# Patient Record
Sex: Female | Born: 1965 | Race: Asian | Hispanic: No | Marital: Married
Health system: Southern US, Community
[De-identification: ages and names within clinical notes are randomized; demographics above are authoritative.]

---

## 2000-10-28 ENCOUNTER — Other Ambulatory Visit: Admission: RE | Admit: 2000-10-28 | Discharge: 2000-10-28 | Payer: Self-pay | Admitting: *Deleted

## 2000-10-28 ENCOUNTER — Other Ambulatory Visit: Admission: RE | Admit: 2000-10-28 | Discharge: 2000-10-28 | Payer: Self-pay | Admitting: Unknown Physician Specialty

## 2001-03-26 ENCOUNTER — Inpatient Hospital Stay (HOSPITAL_COMMUNITY): Admission: AD | Admit: 2001-03-26 | Discharge: 2001-04-02 | Payer: Self-pay | Admitting: Obstetrics and Gynecology

## 2001-03-26 ENCOUNTER — Encounter (INDEPENDENT_AMBULATORY_CARE_PROVIDER_SITE_OTHER): Payer: Self-pay

## 2001-03-26 ENCOUNTER — Encounter: Payer: Self-pay | Admitting: Obstetrics and Gynecology

## 2001-03-27 ENCOUNTER — Encounter: Payer: Self-pay | Admitting: *Deleted

## 2001-04-03 ENCOUNTER — Encounter: Admission: RE | Admit: 2001-04-03 | Discharge: 2001-05-03 | Payer: Self-pay | Admitting: *Deleted

## 2001-05-03 ENCOUNTER — Other Ambulatory Visit: Admission: RE | Admit: 2001-05-03 | Discharge: 2001-05-03 | Payer: Self-pay | Admitting: *Deleted

## 2002-01-13 ENCOUNTER — Other Ambulatory Visit: Admission: RE | Admit: 2002-01-13 | Discharge: 2002-01-13 | Payer: Self-pay | Admitting: *Deleted

## 2002-05-31 ENCOUNTER — Observation Stay (HOSPITAL_COMMUNITY): Admission: AD | Admit: 2002-05-31 | Discharge: 2002-06-01 | Payer: Self-pay | Admitting: Obstetrics and Gynecology

## 2002-05-31 ENCOUNTER — Encounter: Payer: Self-pay | Admitting: Obstetrics and Gynecology

## 2002-06-01 ENCOUNTER — Encounter: Payer: Self-pay | Admitting: Cardiovascular Disease

## 2002-06-01 ENCOUNTER — Encounter: Payer: Self-pay | Admitting: Obstetrics and Gynecology

## 2002-07-01 ENCOUNTER — Inpatient Hospital Stay (HOSPITAL_COMMUNITY): Admission: AD | Admit: 2002-07-01 | Discharge: 2002-07-01 | Payer: Self-pay | Admitting: Obstetrics & Gynecology

## 2002-07-14 ENCOUNTER — Inpatient Hospital Stay (HOSPITAL_COMMUNITY): Admission: AD | Admit: 2002-07-14 | Discharge: 2002-07-14 | Payer: Self-pay | Admitting: Obstetrics & Gynecology

## 2002-08-22 ENCOUNTER — Inpatient Hospital Stay (HOSPITAL_COMMUNITY): Admission: AD | Admit: 2002-08-22 | Discharge: 2002-08-26 | Payer: Self-pay | Admitting: *Deleted

## 2002-08-23 ENCOUNTER — Encounter (INDEPENDENT_AMBULATORY_CARE_PROVIDER_SITE_OTHER): Payer: Self-pay

## 2002-09-16 ENCOUNTER — Other Ambulatory Visit: Admission: RE | Admit: 2002-09-16 | Discharge: 2002-09-16 | Payer: Self-pay | Admitting: *Deleted

## 2002-11-01 ENCOUNTER — Inpatient Hospital Stay (HOSPITAL_COMMUNITY): Admission: AD | Admit: 2002-11-01 | Discharge: 2002-11-01 | Payer: Self-pay | Admitting: *Deleted

## 2003-10-09 ENCOUNTER — Other Ambulatory Visit: Admission: RE | Admit: 2003-10-09 | Discharge: 2003-10-09 | Payer: Self-pay | Admitting: *Deleted

## 2006-11-25 ENCOUNTER — Ambulatory Visit (HOSPITAL_COMMUNITY): Admission: RE | Admit: 2006-11-25 | Discharge: 2006-11-25 | Payer: Self-pay | Admitting: *Deleted

## 2006-12-09 ENCOUNTER — Encounter: Admission: RE | Admit: 2006-12-09 | Discharge: 2006-12-09 | Payer: Self-pay | Admitting: *Deleted

## 2007-11-29 ENCOUNTER — Encounter: Admission: RE | Admit: 2007-11-29 | Discharge: 2007-11-29 | Payer: Self-pay | Admitting: *Deleted

## 2008-11-07 ENCOUNTER — Encounter: Admission: RE | Admit: 2008-11-07 | Discharge: 2008-11-07 | Payer: Self-pay | Admitting: Psychiatry

## 2010-08-21 IMAGING — CT CT HEAD W/O CM
1 series · 16 of 28 positions shown, 20 images · non-contrast
Comparison: None

CLINICAL DATA: Headaches.

CT HEAD WITHOUT CONTRAST
TECHNIQUE: Contiguous axial images were obtained from the base of
the skull through the vertex without contrast.

[Series 32: 3d filtered head · axial · 0.49mm/px · z∈[+28,+159]mm · 16 of 28 slices shown, 20 images]
[im 2/28  brain]
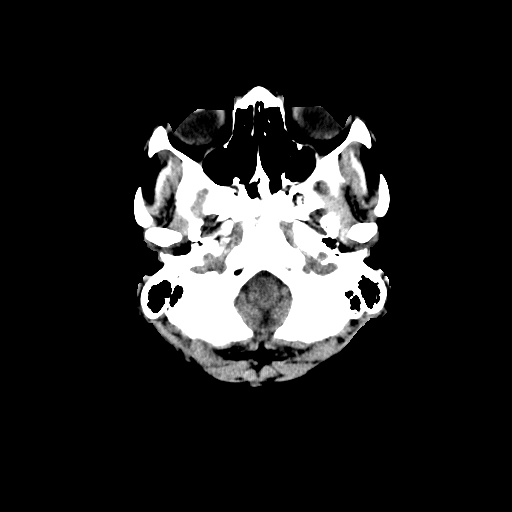
[im 2/28  bone]
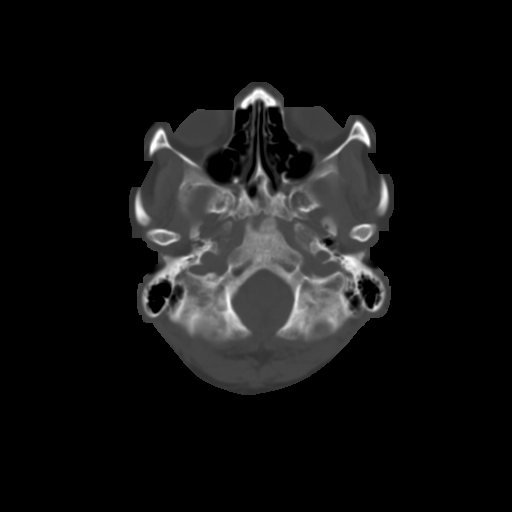
[im 4/28  brain]
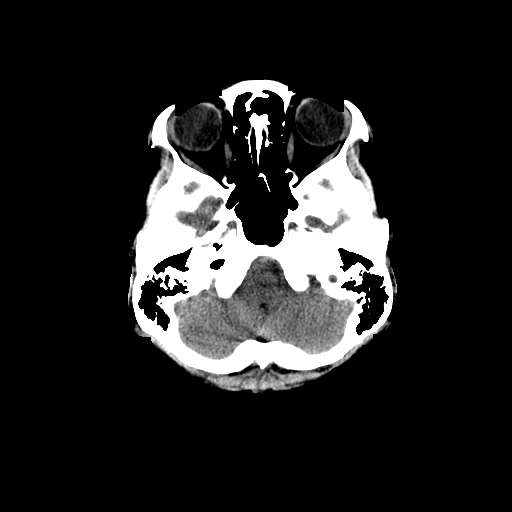
[im 6/28  brain]
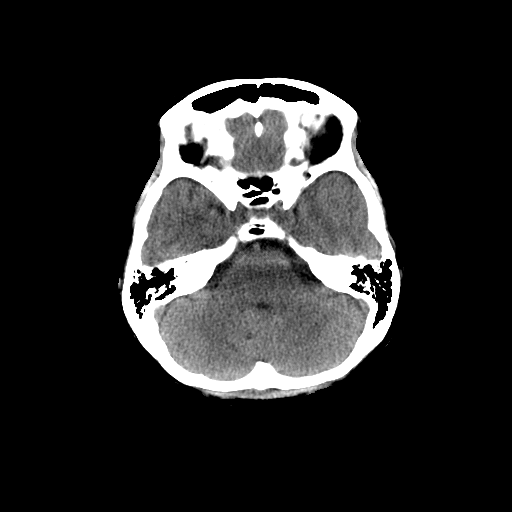
[im 7/28  brain]
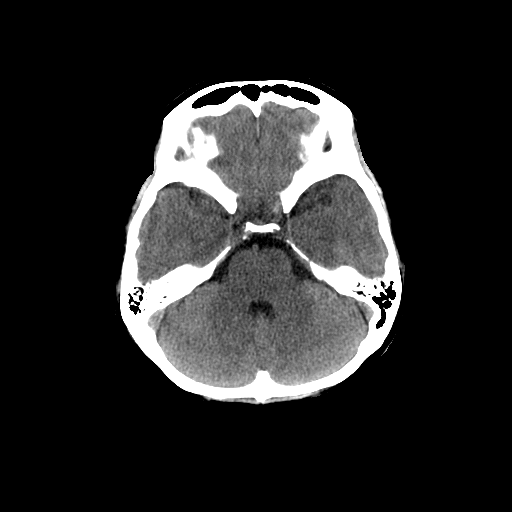
[im 9/28  brain]
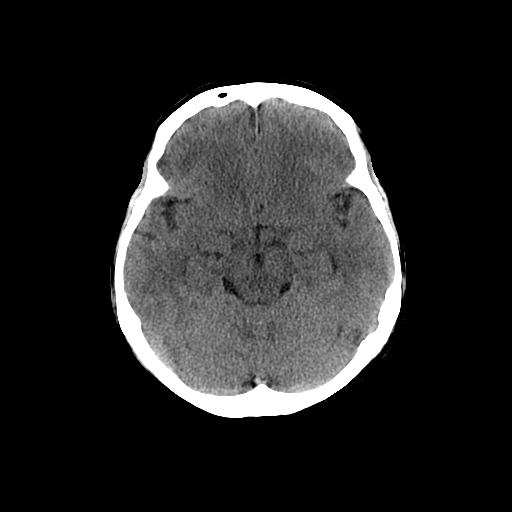
[im 9/28  bone]
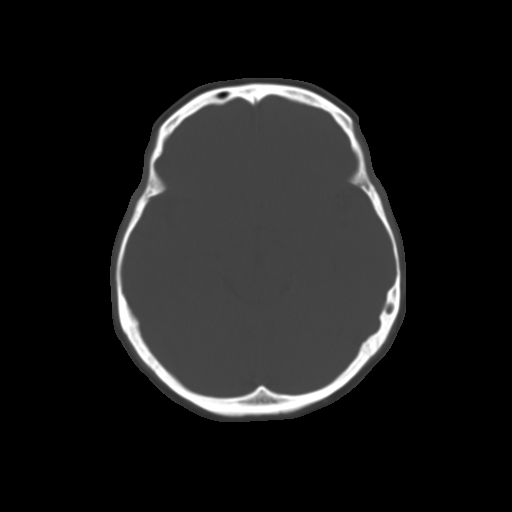
[im 10/28  brain]
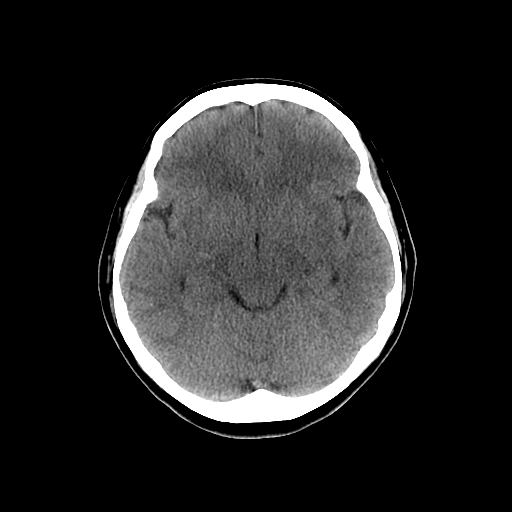
[im 12/28  brain]
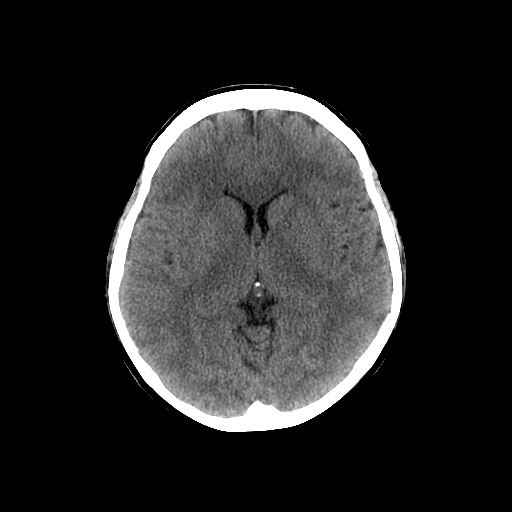
[im 14/28  brain]
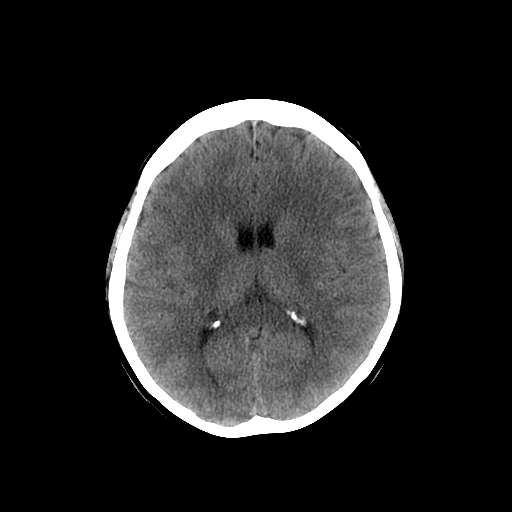
[im 15/28  brain]
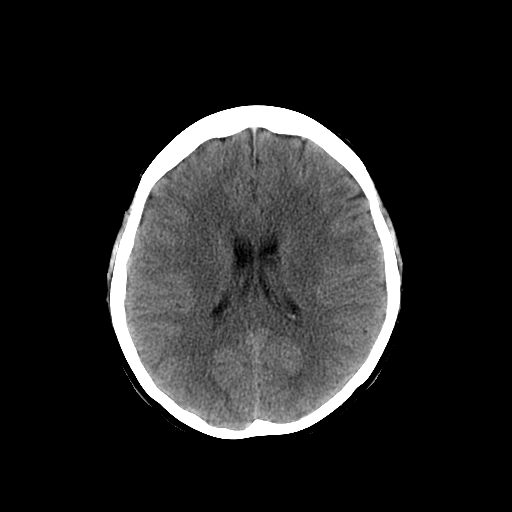
[im 15/28  bone]
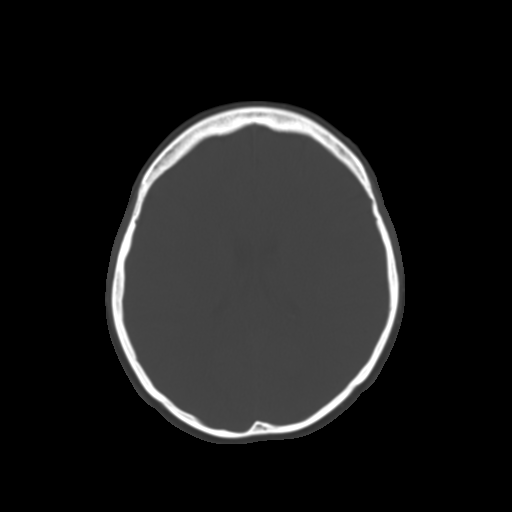
[im 17/28  brain]
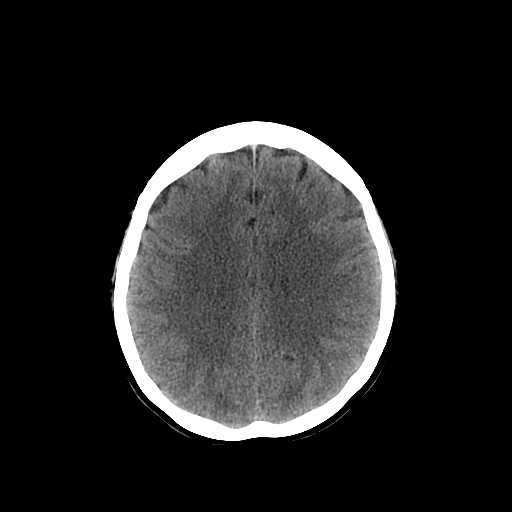
[im 19/28  brain]
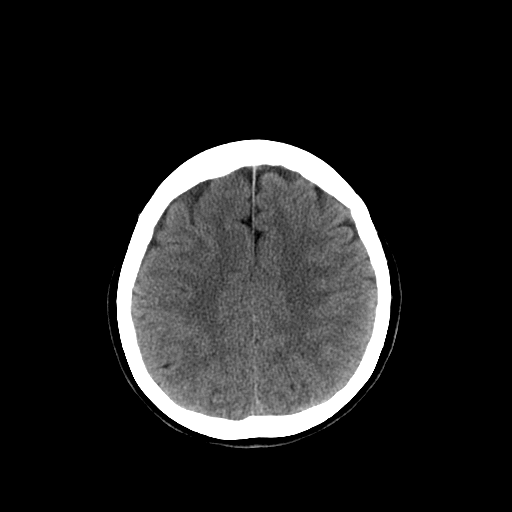
[im 20/28  brain]
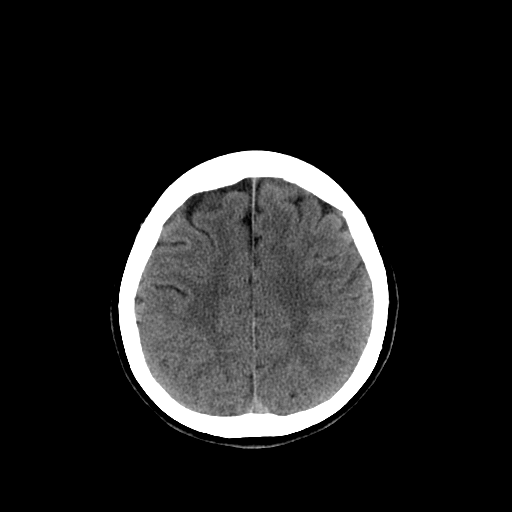
[im 22/28  brain]
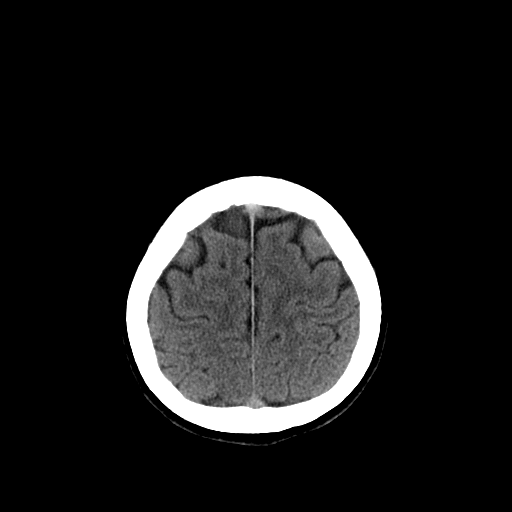
[im 22/28  bone]
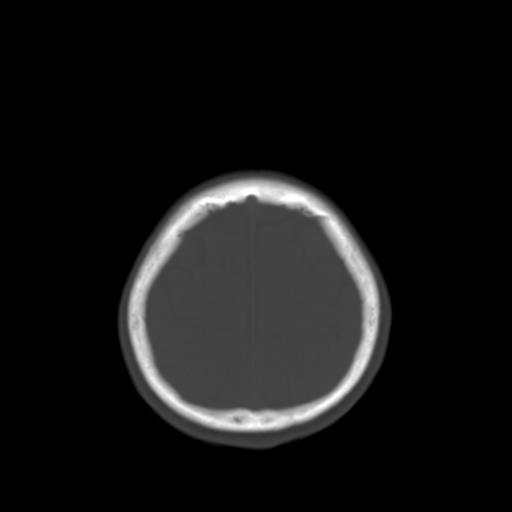
[im 23/28  brain]
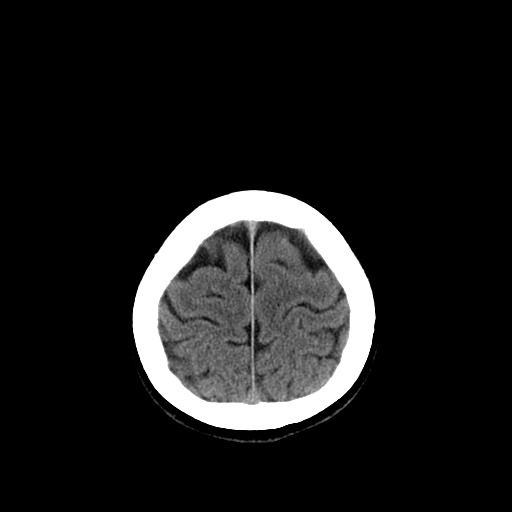
[im 25/28  brain]
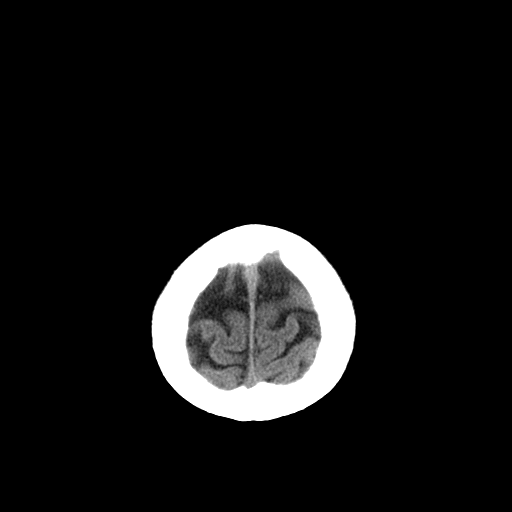
[im 27/28  brain]
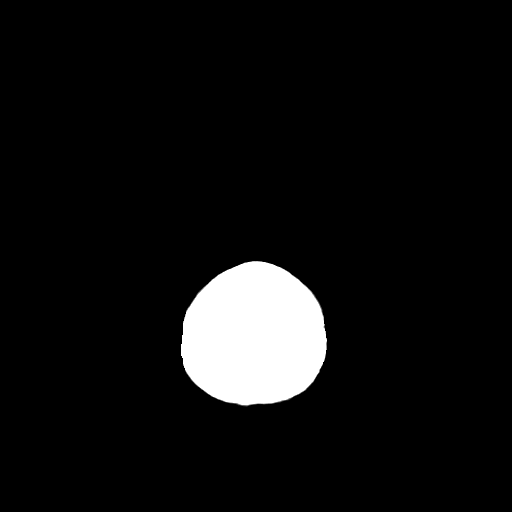

[16 of 28 positions shown; findings below may reference images not displayed]

FINDINGS: Bone windows demonstrate clear paranasal sinuses and
mastoid air cells.

Soft tissue windows demonstrate no  mass lesion, hemorrhage,
hydrocephalus, acute infarct, intra-axial, or extra-axial fluid
collection.
IMPRESSION: No acute intracranial abnormality.

## 2010-11-17 ENCOUNTER — Encounter: Payer: Self-pay | Admitting: *Deleted

## 2011-03-14 NOTE — Op Note (Signed)
NAME:  Cindy Sherman, Cindy Sherman                     ACCOUNT NO.:  [id number]   MEDICAL RECORD NO.:  [id number]                   PATIENT TYPE:  INP   LOCATION:  9135                                 FACILITY:  [facility]   PHYSICIAN:  [place] B. Earlene Plater, M.D.               DATE OF BIRTH:  1966-04-04   DATE OF PROCEDURE:  08/23/2002  DATE OF DISCHARGE:                                 OPERATIVE REPORT   PREOPERATIVE DIAGNOSIS:  Failure to progress, with chorioamnionitis and  __________  variables.   POSTOPERATIVE DIAGNOSES:  Failure to progress, with chorioamnionitis and  __________ variables.   PROCEDURE:  Primary low-transverse cesarean section.   SURGEON:  Chester Holstein. Earlene Plater, M.D.   ANESTHESIA:  Epidural.   FINDINGS:  Viable female, Apgars 9 and 9.  Weight 7 pounds 7 ounces.  Nuchal  cord x1.  Normal appearing uterus, tubes and ovaries.   ESTIMATED BLOOD LOSS:  800 cc.   URINE OUTPUT:  300 cc.   FLUIDS:  2000 cc.   DRAINS:  Foley.   COMPLICATIONS:  None.   INDICATIONS:  The patient presented after spontaneous rupture of membranes  and a spontaneous labor.  The labor was augmented and adequate contractions  documented by IUPC.  The patient did not dilate beyond 7 cm for greater than  three hours, despite NVUs in the 200-300 range.  She also developed a fever  in labor to 101.5 just prior to the cesarean section.  Given these issues,  the patient was delivered by a primary cesarean section.   DESCRIPTION OF PROCEDURE:  The patient was taken to the operating room with  epidural anesthesia in place.  She was prepped and draped in the standard  fashion and a Foley catheter was already in the bladder.   A Pfannenstiel incision was made with a knife and carried sharply to the  underlying fascia, after local infiltration with 10 cc of 0.5% Marcaine.  The fascia was divided sharply in the midline, the incision extended  laterally with Mayo scissors.  The superior aspect of the incision was  elevated with Kocher clamps, and the underlying rectus muscles dissected off  sharply.  __________ in a similar fashion.  The midline of the rectus  muscles was identified and the underlying peritoneum and posterior sheath  elevated with hemostats and entered sharply.  The incision was extended  superiorly and inferiorly, with good visualization of the surrounding  organs.   The bladder blade was inserted and the vesicouterine peritoneum identified  and elevated; bladder flap created with sharp and blunt technique.  The  bladder blade was reinserted and the uterine incision made in a low-  transverse fashion with the knife.  Clear fluid was noted during entry into  the cavity.  The infant's head was elevated through the incision, and with  one vacuum-assisted extraction delivered.  The vacuum was placed in the  midline central portion, just posterior to the  anterior fontanel.  On  traction, no popoffs and no difficulty with delivery.  Nuchal cord x1 was  noted.  The infant's nose and mouth were suctioned with the bulb, and the  remainder of the infant delivered without difficulty.  The cord was clamped  and cut, and the infant handed off to the awaiting pediatricians.   Cefotan 2 g was given at cord clamp.  The placenta was removed by uterine  massage.  The uterus was exteriorized and cleared of all clots and debris.  The bladder blade was reinserted and the uterine incision inspected.  There  was a slight extension on the left lateral aspect of the incision.  However,  it did not approach the broad ligament.   The uterine incision was then closed in a running, locked fashion with #0  Monocryl and a second imbricating layer placed with the same suture.  The  uterus was reinserted into the abdomen and the pelvis irrigated.  There was  a bleeder noted to the left edge of the incision, which was made hemostatic  with a simple stitch of #0 Monocryl.  The remainder of the uterine incision   and bladder flap were hemostatic.  The subfascial space was inspected and  made hemostatic with the Bovie.  The fascia was closed with a running stitch  of #0 Vicryl.  The subcutaneous tissue was irrigated and made hemostatic  with the Bovie.  The skin was closed with staples.   The patient tolerated the procedure well and there were no complications.  She was taken to the recovery room awake, alert and in stable condition.  All counts were correct and verified by staff.                                               Gerri Spore B. Earlene Plater, M.D.    WBD/MEDQ  D:  08/23/2002  T:  08/23/2002  Job:  [id number]

## 2011-03-14 NOTE — H&P (Signed)
[facility]  Patient:    Cindy Sherman, Cindy Sherman                         MRN: [id number] Adm. Date:  03/26/01 Attending:  Silverio Lay, M.D.                         History and Physical  REASON FOR ADMISSION:         Intrauterine pregnancy, at 31 weeks with third trimester bleeding.  HISTORY OF PRESENT ILLNESS:   This is a 45 year old, married, Asian female, gravida 1, para 0, with a last menstrual period of August 22, 2000, and a due date of May 29, 2001, who called after being awakened by a pool of blood and feeling wet.  She reports a large amount of blood, no pain, no fetal activity. When she called she mentioned she was in [facility] and I asked if bleeding was this heavy, for her to stop at [facility] or to call the ambulance.  Husband declined and said they were on their way to [facility].  Upon their arrival, the patient had blood to her ankles, no pain, fetal heart rate tones were 140s to 150s and there was active bleeding.  A rapid evaluation revealed about 200 cc of clots in the vagina.  No active bleeding after clots were evacuated and a closed long and thick cervix.  PRENATAL COURSE:              Reveals blood type A positive, RPR nonreactive, rubella-immune, HBsAg negative.  HIV nonreactive.  Pap smear normal. Gonorrhea, Chlamydia negative.  Pregnancy conceived on gonadotropin cycle due to long-term infertility with a dating ultrasound confirming estimated date of delivery.  A 16-week AFP was within normal limits.  A 16-week hemoglobin was 9.4.  A 20-week ultrasound report unavailable, but per patient revealed normal anatomy survey and normal location of placenta.  Per patient, recent 28-week glucose tolerance test was elevated and three-hour glucose tolerance test was also elevated diagnosing gestational diabetes.  The patient had an appointment with the nutritional center tomorrow for management of gestational  diabetes.  PAST MEDICAL HISTORY:         Infertility for five years.  History of ovarian cyst.  FAMILY HISTORY:               Unremarkable.  SOCIAL HISTORY:               Married, non smoker, is a Best boy.  PHYSICAL EXAMINATION:  VITAL SIGNS:                  Vital signs with pulse at 115, 117, blood pressure 138/80-85.  HEENT:                        Negative.  LUNGS:                        Clear.  HEART:                        Normal.  ABDOMEN:                      Gravid, size equal dates, nontender.  GYNECOLOGIC:  Vulva normal.  Vagina 200 cc of clots removed. No active bleeding from the cervical os.  Cervix is closed and long.  EXTREMITIES:                  Negative.  NST is reactive with some uterine irritability felt as mild cramping by the patient.  LABORATORY DATA:              Labs reveal hemoglobin 9.9, platelet count 335. Fibrinogen is high at 641 which is normal.  PT is normal.  D-dimer are slightly elevated at 0.55.  Blood type was A positive.  Preliminary verbal report on ultrasound reveals no evidence of abruptio normal amniotic fluid. Cervical length at 3 cm.  ASSESSMENT:                   Intrauterine pregnancy, at 31 weeks, third trimester bleeding with abruptio placentae.  No evidence of fetal compromise at this point.  PLAN:                         The patient is admitted with continuous monitoring, continuous lab work.  She is given betamethasone 12.5 mg IM now. A Foley catheter is inserted.  She will be maintained on complete bed rest. Both the patient and husband are aware we are in acute phase of abruptio and the need for cesarean section may arise.  At this point, no evidence of maternaL or fetal compromise.  No evidence of large abruptio.  Bleeding has significantly decreased.  We will try to avoid delivery at this point. DD:  03/26/01 TD:  03/26/01 Job: [id number]

## 2011-03-14 NOTE — Discharge Summary (Signed)
NAME:  Cindy Sherman, Cindy Sherman                     ACCOUNT NO.:  [id number]   MEDICAL RECORD NO.:  [id number]                   PATIENT TYPE:  INP   LOCATION:  9135                                 FACILITY:  [facility]   PHYSICIAN:  [place] B. Earlene Plater, M.D.               DATE OF BIRTH:  [birth year]   DATE OF ADMISSION:  08/22/2002  DATE OF DISCHARGE:  08/26/2002                                 DISCHARGE SUMMARY   ADMISSION DIAGNOSES:  1. A 37 week intrauterine pregnancy.  2. Spontaneous labor and spontaneous rupture of membranes.  3. Failure to progress beyond 7 cm.  4. Chorioamnionitis.  5. Repetitive variable decelerations.   DISCHARGE DIAGNOSES:  1. A 37 week intrauterine pregnancy.  2. Spontaneous labor and spontaneous rupture of membranes.  3. Failure to progress beyond 7 cm.  4. Chorioamnionitis.  5. Repetitive variable decelerations.   PROCEDURE:  1. Admission for management of labor.  2. Cesarean section for failure to progress, chorioamnionitis, and     repetitive variable decelerations.   HISTORY OF PRESENT ILLNESS:  For complete details please see the history and  physical in the chart.  Basically, patient presented as a 45 year old Asian  female gravida 2, para 1 at 37+ weeks with painful contractions and  spontaneous rupture of membranes.  The patient had previously declined  amniocentesis and had normal triple screen.   HOSPITAL COURSE:  The patient was admitted to labor and delivery, managed  expectantly for her spontaneous labor.  She progressed to 5 cm, completely  effaced, -1 station without further progress.  Subsequently intrauterine  pressure catheter was placed and there was no dilation greater than 7 cm for  greater than three hours despite adequate [place] units.  Also, patient  developed fever to 101 and repetitive variable decelerations.  Given the  above issues, we recommended we proceed with a cesarean section.   The patient was subsequently delivered by  primary low transverse cesarean  section of a viable female infant Apgars 9 and 9, weight 7 pounds 7 ounces.  Nuchal cord x1 noted.  Normal appearing uterus, tubes, and ovaries.   Postoperatively patient rapidly regained her ability to ambulate, void, and  tolerate a regular diet.  She did have a postoperative anemia with a  hemoglobin of 6.3, but was asymptomatic.  Also had known history of  thalassemia with previously normal ferritin.  The patient was discharged  home on the third postoperative day in satisfactory condition.   DISCHARGE MEDICATIONS:  1. Tylox one p.o. q.4-6h. p.r.n. pain.  2. Ferrous sulfate 325 mg p.o. b.i.d.   DISCHARGE INSTRUCTIONS:  Standard preprinted instructions given prior to  dismissal.   DISPOSITION:  Discharge, satisfactory.   FOLLOW UP:  [facility], Dr. Earlene Plater four to six weeks.  Gerri Spore B. Earlene Plater, M.D.    WBD/MEDQ  D:  09/15/2002  T:  09/15/2002  Job:  [id number]

## 2011-03-14 NOTE — Discharge Summary (Signed)
[facility]  Patient:    Cindy Sherman, Cindy Sherman                  MRN: [id number] Adm. Date:  [id number] Disc. Date: [id number] Attending:  Silverio Lay A                           Discharge Summary  ADMISSION DIAGNOSES:          1. A 31-week intrauterine pregnancy.                               2. Third trimester bleeding with associated                                  contractions.                               3. Probable placental abruption.  DISCHARGE DIAGNOSES:          1. A 31-week intrauterine pregnancy.                               2. Third trimester bleeding with associated                                  contractions.                               3. Probable placental abruption.  PROCEDURES:                   1. Admission for continuous electronic fetal                                  monitoring.                               2. Management of labor.  HISTORY OF PRESENT ILLNESS:   For complete details, please see the history and physical in the chart. Briefly, Ms. Lapage presented as a 45 year old Asian female, gravida 1, who had noticed a sudden onset of heavy vaginal bleeding and associated crampy abdominal pain and uterine irritability on tocodynamometer. She had no known risk factors. The only thing out of the ordinary in her prenatal care had been that the conception had occurred by injectable gonadotropins due to history of oligo-ovulation. In addition, she had a history of gestational diabetes in pregnancy which had been controlled by diet alone.  HOSPITAL COURSE:              The patient was admitted to the antepartum service and kept on continuous electronic fetal monitoring and bed rest. The initial episode of bright red vaginal bleeding subsided after a short period of time. DIC panel showed slight elevation of D-dimer and fibrinogen with stable normal hemoglobins.  The patient was continued on monitoring and had intermittent  irregular contractions. She was given betamethasone for fetal lung maturity.  Ultimately, the patient developed increasingly painful contractions and developed  preterm labor on March 31, 2001. Patient subsequently progressed and delivered by spontaneous vaginal delivery a viable female infant on March 31, 2001 at 0959 hours. Apgars were 2, 5, and 7 at one, five, and 10 minutes, respectively. Second-degree episiotomy was performed to hasten delivery due to severe variables. NICU team was present for delivery. Initially after delivery, the infant was hypotonic and was intubated with good color and heart rate throughout. Clinical diagnosis of abruptio placenta was made given the history as well as the fact that the placenta was expelled spontaneously, almost immediately after the delivery. Cord pH was obtained and was within normal range.  Postpartum, the patient rapidly regained her ability to ambulate, void, and tolerate a regular diet. She was discharged home on the second postpartum day. Her postpartum hemoglobin was 9.1. She was known to be A positive and rubella immune.  DISCHARGE INSTRUCTIONS:       Standard preprinted instructions were given prior to dismissal.  DISCHARGE MEDICATIONS:        1. Ferrous sulfate 325 mg p.o. bid for mild                                  anemia.                               2. Ibuprofen 600 mg every six hours as needed                                  for crampy pain.  DISCHARGE FOLLOWUP:           The patient is to follow up at [facility] in four to six weeks with Dr. Earlene Plater.  PATHOLOGY:                    The placenta was sent to pathology and the microscopic diagnosis was pending at time of discharge. DD:  04/19/01 TD:  04/19/01 Job: [id number]

## 2011-03-14 NOTE — Consult Note (Signed)
NAME:  Cindy Sherman, Cindy Sherman                     ACCOUNT NO.:  [id number]   MEDICAL RECORD NO.:  [id number]                   PATIENT TYPE:  MAT   LOCATION:  [facility]C                                 FACILITY:  [facility]   PHYSICIAN:  Thomas C. Wall, M.D. [facility]            DATE OF BIRTH:  [birth year]   DATE OF CONSULTATION:  05/31/2002  DATE OF DISCHARGE:                              CARDIOLOGY CONSULTATION   REASON FOR CONSULTATION:  I was asked by Dr. Cherly Hensen to evaluate the patient  with fever, cough, and ventricular bigeminy with multiple premature  ventricular contractions and sinus tachycardia.   HISTORY OF PRESENT ILLNESS:  The patient is a 45 year old [place] female [redacted]  weeks pregnant who noticed fever up to 102 today.  On presentation to  [facility], she had a temperature of 101.5.  EKG  showed sinus tachycardia, rate 116, with frequent PVC's and ventricular  bigeminy on one EKG.   She gives no cardiac history.  She denies any shortness of breath,  orthopnea, PND, or peripheral edema.  She has had no fever, chills, night  sweats, painful digits or palms.  She denies any problem other than a  productive cough a couple weeks ago that is now nonproductive.  She denies  any hemoptysis.   There is no history of rheumatic fever or other cardiac disease.  The  patient denies any visual changes as well.  She has had no chest pain.   LABORATORY DATA:  Potassium 3.8, calcium 8.5.  WBC count was normal without  a shift.  UA that was clean.   Chest x-ray that shows no acute cardiopulmonary disease.   PHYSICAL EXAMINATION:  VITAL SIGNS:  Blood pressure 116/72, pulse 116 in  sinus tachycardia.  She is having frequent PVCs on her monitor.  There has  been no nonsustained ventricular tachycardia.  HEENT:  No meningismus.  Her sclerae are clear.  Extraocular movements are  intact.  Pupils are equal, round, and responsive to light and accommodation.  There is no malar rash.   Carotid upstrokes are equal bilaterally without  bruits.  There is no JVD.  There is no obvious adenopathy.  There is no  thyromegaly.  LUNGS:  Clear to auscultation and percussion.  CARDIAC:  Nondisplaced PMI.  She has normal S1 and normal S2 that splits.  She has a 2/6 systolic murmur that peaks in midsystole, consistent with  benign flow murmur.  There is no diastolic murmur.  I could hear no click or  rub.  ABDOMEN:  Soft and gravida.  Good bowel sounds are present.  EXTREMITIES:  No edema.  Pulses were brisk bilaterally.  There is no  evidence of splinter hemorrhages, flame hemorrhages or Os[place] nodes.   IMPRESSION:  Doubt organic heart disease.  We have to rule out mitral valve  prolapse which can be associated with increased ectopy during pregnancy,  hypomagnesemia, and thyroid disease.  Also, we checked  two blood cultures to  rule out subacute bacterial endocarditis, though I find this doubtful.   Thank you very much for the consultation.                                              Thomas C. Daleen Squibb, M.D. [facility]   TCW/MEDQ  D:  05/31/2002  T:  06/04/2002  Job:  [id number]

## 2020-01-06 ENCOUNTER — Ambulatory Visit: Payer: Self-pay | Attending: Internal Medicine

## 2020-01-06 DIAGNOSIS — Z23 Encounter for immunization: Secondary | ICD-10-CM

## 2020-01-06 NOTE — Progress Notes (Signed)
   Covid-19 Vaccination Clinic  Name:  ALONNIE BIEKER    MRN: [id number] DOB: 24-Sep-1966  01/06/2020  Ms. Giraldo was observed post Covid-19 immunization for 15 minutes without incident. She was provided with Vaccine Information Sheet and instruction to access the V-Safe system.   Ms. Woodin was instructed to call 911 with any severe reactions post vaccine: Marland Kitchen Difficulty breathing  . Swelling of face and throat  . A fast heartbeat  . A bad rash all over body  . Dizziness and weakness   Immunizations Administered    Name Date Dose VIS Date Route   Pfizer COVID-19 Vaccine 01/06/2020 12:23 PM 0.3 mL 10/07/2019 Intramuscular   Manufacturer: [employer], [employer]   Lot: [id number]   NDC: [address]-8

## 2020-01-31 ENCOUNTER — Ambulatory Visit: Payer: Self-pay | Attending: Internal Medicine

## 2020-01-31 DIAGNOSIS — Z23 Encounter for immunization: Secondary | ICD-10-CM

## 2020-01-31 NOTE — Progress Notes (Signed)
   Covid-19 Vaccination Clinic  Name:  Cindy Sherman    MRN: [id number] DOB: 11-03-1965  01/31/2020  Cindy Sherman was observed post Covid-19 immunization for 15 minutes without incident. She was provided with Vaccine Information Sheet and instruction to access the V-Safe system.   Cindy Sherman was instructed to call 911 with any severe reactions post vaccine: Marland Kitchen Difficulty breathing  . Swelling of face and throat  . A fast heartbeat  . A bad rash all over body  . Dizziness and weakness   Immunizations Administered    Name Date Dose VIS Date Route   Pfizer COVID-19 Vaccine 01/31/2020  1:50 PM 0.3 mL 10/07/2019 Intramuscular   Manufacturer: [employer], [employer]   Lot: [id number]   NDC: [address]-9

## 2020-08-25 ENCOUNTER — Ambulatory Visit: Payer: Self-pay | Attending: Internal Medicine

## 2020-08-25 ENCOUNTER — Ambulatory Visit: Payer: Self-pay

## 2020-08-25 DIAGNOSIS — Z23 Encounter for immunization: Secondary | ICD-10-CM

## 2020-08-25 NOTE — Progress Notes (Signed)
   Covid-19 Vaccination Clinic  Name:  Cindy Sherman    MRN: [id number] DOB: 1966/06/07  08/25/2020  Cindy Sherman was observed post Covid-19 immunization for 15 minutes without incident. She was provided with Vaccine Information Sheet and instruction to access the V-Safe system.   Cindy Sherman was instructed to call 911 with any severe reactions post vaccine: Marland Kitchen Difficulty breathing  . Swelling of face and throat  . A fast heartbeat  . A bad rash all over body  . Dizziness and weakness

## 2021-07-17 ENCOUNTER — Other Ambulatory Visit (HOSPITAL_BASED_OUTPATIENT_CLINIC_OR_DEPARTMENT_OTHER): Payer: Self-pay

## 2021-07-17 ENCOUNTER — Ambulatory Visit: Payer: Self-pay | Attending: Internal Medicine

## 2021-07-17 DIAGNOSIS — Z23 Encounter for immunization: Secondary | ICD-10-CM

## 2021-07-17 MED ORDER — INFLUENZA VAC SPLIT QUAD 0.5 ML IM SUSY
PREFILLED_SYRINGE | INTRAMUSCULAR | 0 refills | Status: AC
  Filled 2021-07-17: qty 0.5, 1d supply, fill #0

## 2021-07-17 NOTE — Progress Notes (Signed)
   Covid-19 Vaccination Clinic  Name:  ROBYN GALATI    MRN: [id number] DOB: 11/14/1965  07/17/2021  Ms. Comley was observed post Covid-19 immunization for 15 minutes without incident. She was provided with Vaccine Information Sheet and instruction to access the V-Safe system.   Ms. Scarpati was instructed to call 911 with any severe reactions post vaccine: Difficulty breathing  Swelling of face and throat  A fast heartbeat  A bad rash all over body  Dizziness and weakness

## 2021-07-23 ENCOUNTER — Other Ambulatory Visit (HOSPITAL_BASED_OUTPATIENT_CLINIC_OR_DEPARTMENT_OTHER): Payer: Self-pay

## 2021-07-23 MED ORDER — COVID-19MRNA BIVAL VACC PFIZER 30 MCG/0.3ML IM SUSP
INTRAMUSCULAR | 0 refills | Status: AC
  Filled 2021-07-23: qty 0.3, 1d supply, fill #0
